# Patient Record
Sex: Male | Born: 1955 | Race: White | Hispanic: No | Marital: Married | Smoking: Never smoker
Health system: Southern US, Community
[De-identification: ages and names within clinical notes are randomized; demographics above are authoritative.]

## PROBLEM LIST (undated history)

## (undated) DIAGNOSIS — E785 Hyperlipidemia, unspecified: Secondary | ICD-10-CM

## (undated) DIAGNOSIS — T7840XA Allergy, unspecified, initial encounter: Secondary | ICD-10-CM

## (undated) DIAGNOSIS — I10 Essential (primary) hypertension: Secondary | ICD-10-CM

## (undated) DIAGNOSIS — K219 Gastro-esophageal reflux disease without esophagitis: Secondary | ICD-10-CM

## (undated) HISTORY — PX: COLONOSCOPY: SHX174

## (undated) HISTORY — PX: POLYPECTOMY: SHX149

## (undated) HISTORY — DX: Gastro-esophageal reflux disease without esophagitis: K21.9

## (undated) HISTORY — DX: Allergy, unspecified, initial encounter: T78.40XA

## (undated) HISTORY — DX: Hyperlipidemia, unspecified: E78.5

## (undated) HISTORY — DX: Essential (primary) hypertension: I10

---

## 2006-05-28 ENCOUNTER — Observation Stay (HOSPITAL_COMMUNITY): Admission: EM | Admit: 2006-05-28 | Discharge: 2006-05-28 | Payer: Self-pay | Admitting: Emergency Medicine

## 2006-05-28 ENCOUNTER — Ambulatory Visit: Payer: Self-pay | Admitting: Cardiology

## 2008-05-04 IMAGING — CR DG CHEST 1V PORT
1 series · 1 of 1 positions shown · non-contrast
Comparison: none

CLINICAL DATA: Chest pain and shortness of breath.  
 PORTABLE CHEST - 1 VIEW 05/28/06:

[view not recorded]
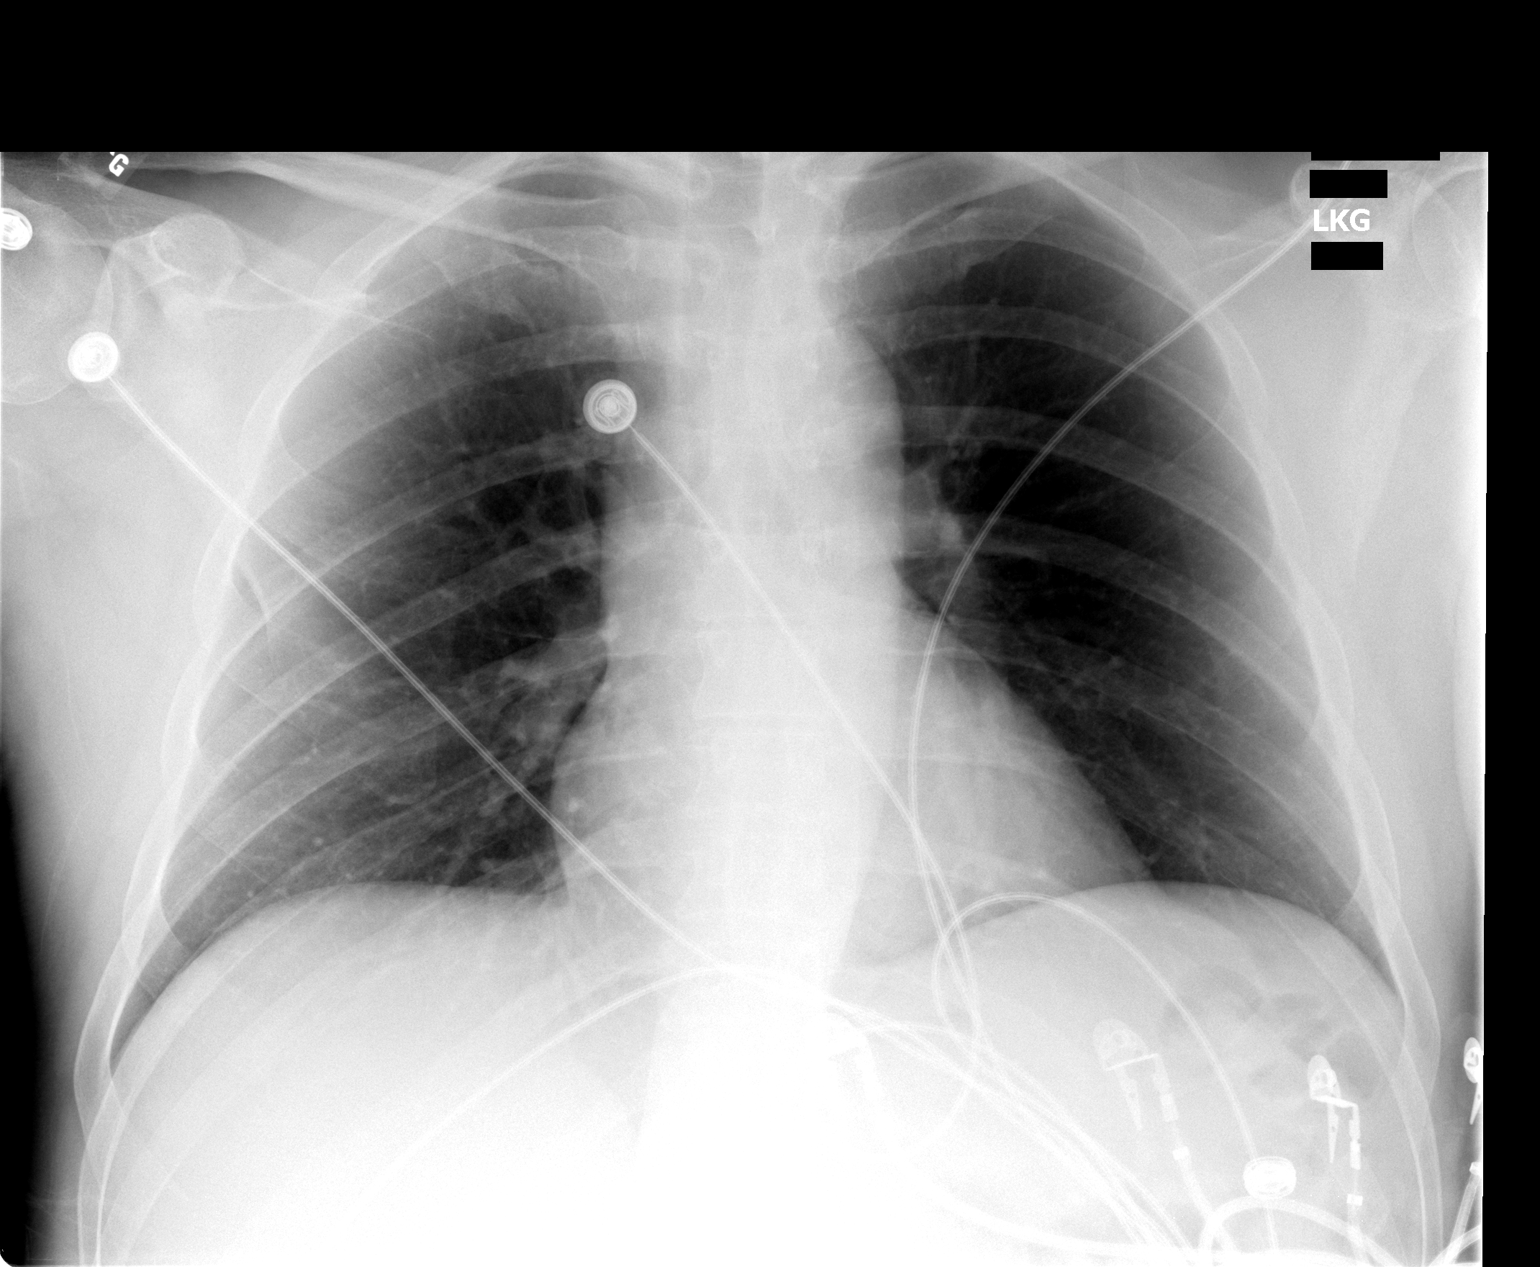

[1 of 1 positions shown; findings below may reference images not displayed]

FINDINGS: The heart size and mediastinal contours are within normal limits.  Both lungs are clear.
IMPRESSION: No acute findings.

## 2009-07-21 ENCOUNTER — Encounter (INDEPENDENT_AMBULATORY_CARE_PROVIDER_SITE_OTHER): Payer: Self-pay | Admitting: *Deleted

## 2009-07-22 ENCOUNTER — Ambulatory Visit: Payer: Self-pay | Admitting: Gastroenterology

## 2009-07-26 ENCOUNTER — Ambulatory Visit: Payer: Self-pay | Admitting: Gastroenterology

## 2009-08-03 ENCOUNTER — Encounter: Payer: Self-pay | Admitting: Gastroenterology

## 2010-10-06 NOTE — Letter (Signed)
Summary: [facility] Instructions  [facility]  [address] [place], [place] [address]   Phone: [phone]  Fax: [phone]       Johnny [name]    1955/09/08    MRN: [id number]        Procedure Day /Date: Monday 07/26/09     Arrival Time: 7:30 a.m.     Procedure Time: 8:30 a.m.     Location of Procedure:                       Endoscopy Center (4th Floor)                         PREPARATION FOR COLONOSCOPY WITH MOVIPREP   Starting 5 days prior to your procedure  07/21/09  do not eat nuts, seeds, popcorn, corn, beans, peas,  salads, or any raw vegetables.  Do not take any fiber supplements (e.g. Metamucil, Citrucel, and Benefiber).  THE DAY BEFORE YOUR PROCEDURE         DATE:  07/25/09 DAY: Sunday  1.  Drink clear liquids the entire day-NO SOLID FOOD  2.  Do not drink anything colored red or purple.  Avoid juices with pulp.  No orange juice.  3.  Drink at least 64 oz. (8 glasses) of fluid/clear liquids during the day to prevent dehydration and help the prep work efficiently.  CLEAR LIQUIDS INCLUDE: Water Jello Ice Popsicles Tea (sugar ok, no milk/cream) Powdered fruit flavored drinks Coffee (sugar ok, no milk/cream) Gatorade Juice: apple, white grape, white cranberry  Lemonade Clear bullion, consomm, broth Carbonated beverages (any kind) Strained chicken noodle soup Hard Candy                             4.  In the morning, mix first dose of MoviPrep solution:    Empty 1 Pouch A and 1 Pouch B into the disposable container    Add lukewarm drinking water to the top line of the container. Mix to dissolve    Refrigerate (mixed solution should be used within 24 hrs)  5.  Begin drinking the prep at 5:00 p.m. The MoviPrep container is divided by 4 marks.   Every 15 minutes drink the solution down to the next mark (approximately 8 oz) until the full liter is complete.    6.  Follow completed prep with 16 oz of clear liquid of your choice (Nothing red or purple).  Continue to drink clear liquids until bedtime.  7.  Before going to bed, mix second dose of MoviPrep solution:    Empty 1 Pouch A and 1 Pouch B into the disposable container    Add lukewarm drinking water to the top line of the container. Mix to dissolve    Refrigerate  THE DAY OF YOUR PROCEDURE      DATE:  07/26/09 DAY: Monday  Beginning at  3:30a.m. (5 hours before procedure):         1. Every 15 minutes, drink the solution down to the next mark (approx 8 oz) until the full liter is complete.  2. Follow completed prep with 16 oz. of clear liquid of your choice.    3. You may drink clear liquids until  6:30 a.m(2 HOURS BEFORE PROCEDURE).   MEDICATION INSTRUCTIONS  Unless otherwise instructed, you should take regular prescription medications with a small sip of water   as early as possible  the morning of your procedure.        OTHER INSTRUCTIONS  You will need a responsible adult at least 53 years of age to accompany you and drive you home.   This person must remain in the waiting room during your procedure.  Wear loose fitting clothing that is easily removed.  Leave jewelry and other valuables at home.  However, you may wish to bring a book to read or  an iPod/MP3 player to listen to music as you wait for your procedure to start.  Remove all body piercing jewelry and leave at home.  Total time from sign-in until discharge is approximately 2-3 hours.  You should go home directly after your procedure and rest.  You can resume normal activities the  day after your procedure.  The day of your procedure you should not:   Drive   Make legal decisions   Operate machinery   Drink alcohol   Return to work  You will receive specific instructions about eating, activities and medications before you leave.     The above instructions have been reviewed and explained to me by   Ezra Sites RN  July 22, 2009 8:08 AM     I fully understand and can verbalize these instructions _____________________________ Date _________

## 2010-10-06 NOTE — Procedures (Signed)
Summary: Colonoscopy  Patient: [name] Note: All result statuses are Final unless otherwise noted.  Tests: (1) Colonoscopy (COL)   COL Colonoscopy           DONE     [facility]     [address]     [place], [place]  [address]           COLONOSCOPY PROCEDURE REPORT           PATIENT:  Johnny [name], Johnny [name]  MR#:  [id number]     BIRTHDATE:  05-28-1956, 53 yrs. old  GENDER:  male           ENDOSCOPIST:  Vania Rea. Jarold Motto, MD, [facility]     Referred by:  Marinda Elk, M.D.           PROCEDURE DATE:  07/26/2009     PROCEDURE:  Colonoscopy with snare polypectomy     ASA CLASS:  Class I     INDICATIONS:  Routine Risk Screening           MEDICATIONS:   Fentanyl 75 mcg IV, Versed 7 mg IV           DESCRIPTION OF PROCEDURE:   After the risks benefits and     alternatives of the procedure were thoroughly explained, informed     consent was obtained.  Digital rectal exam was performed and     revealed no abnormalities.   The LB CF-[id number] endoscope     was introduced through the anus and advanced to the cecum, which     was identified by both the appendix and ileocecal valve, without     limitations.  The quality of the prep was excellent, using     MoviPrep.  The instrument was then slowly withdrawn as the colon     was fully examined.     <<PROCEDUREIMAGES>>           FINDINGS:  Mild diverticulosis was found in the sigmoid colon.  A     sessile polyp was found. It was likely benign. 5mm size. Polyp was     snared, then cauterized with monopolar cautery. Retrieval was     successful. snare polyp  This was otherwise a normal examination     of the colon.   Retroflexed views in the rectum revealed no     abnormalities.    The scope was then withdrawn from the patient     and the procedure completed.           COMPLICATIONS:  None           ENDOSCOPIC IMPRESSION:     1) Mild diverticulosis in the sigmoid colon     2) Sessile polyp     3) Otherwise normal examination      RECOMMENDATIONS:     1) Follow up colonoscopy in 5 years     2) high fiber diet           REPEAT EXAM:  No           ______________________________     Vania Rea. Jarold Motto, MD, [facility]           CC:           n.     eSIGNED:   Vania Rea. Daylin Gruszka at 07/26/2009 08:59 AM           [place], Mellody Dance, [id number]  Note: An exclamation mark (!) indicates a result that was not dispersed into  the flowsheet. Document Creation Date: 07/26/2009 8:59 AM _______________________________________________________________________  (1) Order result status: Final Collection or observation date-time: 07/26/2009 08:53 Requested date-time:  Receipt date-time:  Reported date-time:  Referring Physician:   Ordering Physician: Sheryn Bison [phone]) Specimen Source:  Source: Launa Grill Order Number: [id number] Lab site:   Appended Document: Colonoscopy     Procedures Next Due Date:    Colonoscopy: 08/2014

## 2011-01-20 NOTE — Cardiovascular Report (Signed)
NAME:  Johnny [name], Johnny [name]               ACCOUNT NO.:  [id number]   MEDICAL RECORD NO.:  [id number]          PATIENT TYPE:  INP   LOCATION:  6599                         FACILITY:  [facility]   PHYSICIAN:  Veverly Fells. Excell Seltzer, MD  DATE OF BIRTH:  1955-10-19   DATE OF PROCEDURE:  05/28/2006  DATE OF DISCHARGE:  05/28/2006                              CARDIAC CATHETERIZATION   PROCEDURE:  Left heart catheterization, selective coronary angiography, left  ventricular angiography, aortic root angiography and star close of the right  femoral artery.   INDICATION:  This is a 55 year old male with a very strong family history of  coronary artery disease and myocardial infarction who has a brother who had  an myocardial infarction in his 51's and both parents who had coronary  bypass surgery.  He presented with new onset chest pain to the Emergency  Department today.  His history was very compelling for unstable angina.  He  was subsequently referred for cardiac catheterization.   ACCESS:  Right femoral artery.   COMPLICATIONS:  None.   PROCEDURAL DETAILS:  The risks and indications of the procedure were  explained in detail to the patient.  Informed consent was obtained.  The  right groin was prepped and draped in normal sterile fashion.  Anesthetized  with 1% lidocaine.  Using the modified Seldinger technique a 6 French sheath  was placed in the right femoral artery.  Multiple angiographic views were  taken of the left and right coronary arteries.  The left coronary artery was  engaged with a 6 [place] JL4 catheter.  The right coronary artery was engaged  with a 6 [place] JR4 catheter.  Following coronary angiography a pigtail  catheter was passed across the aortic valve and the left ventricle.  Left  ventricular pressure was recorded.  A 30 degree RAO left ventriculogram was  performed.  Following left ventriculography a pull back was recorded across  the aortic valve.  Following pull back, because  of the patient's chest pain  and unremarkable coronary and left ventricular findings, aortic root  angiogram was performed.  At the conclusion of the case a right femoral  arteriogram was done and access site was in the right common femoral artery.  Therefore a star close closure device was deployed.   FINDINGS:  Aortic pressure 130/79 with a mean of 102.  Left ventricular  pressure 128/9 with an end diastolic pressure of 18.  Left main stem normal.  Bifurcates into the left anterior descending and left circumflex.  LAD is a  large diameter vessel that courses down and wraps around the apex of the  left ventricle.  It gives off two large proximal diagonal vessels.  It then  gives off two distal diagonals as well.  The LAD is normal throughout its  proximal and mid segments.  There is a long area in the distal LAD that has  very mild non-obstructive disease of approximately 20% stenosis.  The  remainder of the diagonals and LAD system are angiographically normal.   Left circumflex covers a very small territory.  Left circumflex courses down  the AV groove and gives off one small obtuse marginal and this system is  angiographically normal.  Right coronary artery is dominant.  It gives off a  large conus branch.  The right coronary gives off an RV marginal branch in  the mid segment.  Distally it bifurcates into the PDA and posterior AV  segment.  The posterior AV segment gives off one branching posterior lateral  branch and the PDA is large diameter and dominant.  There is no angiographic  disease in the right coronary artery.   Left ventriculogram demonstrates normal left ventricular function with an  ejection fraction of 65%.  There is no mitral regurgitation.   LAO, aortic root angiogram was normal with no aortic insufficiency and no  aortic dilatation.   ASSESSMENT:  1. Mild non-obstructive plaque in LAD.  2. Normal left circumflex.  3. Normal right coronary artery.  4. Normal  left ventricular function.  5. Normal aortic root.   PLAN:  As the patient is currently chest pain free and his cardiac  catheterization is unremarkable, I would recommend medical therapy with  aspirin and Atorvastatin.  He should be able to return home later this  evening.  Of note, he did have a D-dimer performed to evaluate for pulmonary  embolus and this was within the normal range.  He should follow-up with Dr.  Daleen Squibb in approximately two to four weeks for outpatient follow-up.      Veverly Fells. Excell Seltzer, MD  Electronically Signed     MDC/MEDQ  D:  05/28/2006  T:  05/30/2006  Job:  [id number]   cc:   Thomas C. Daleen Squibb, MD, [facility]  Joycelyn Rua, M.D.

## 2011-01-20 NOTE — Discharge Summary (Signed)
NAME:  Johnny [name], Johnny [name]               ACCOUNT NO.:  [id number]   MEDICAL RECORD NO.:  [id number]          PATIENT TYPE:  INP   LOCATION:                               FACILITY:  [facility]   PHYSICIAN:  Thomas C. Wall, MD, FACCDATE OF BIRTH:  1956/02/10   DATE OF ADMISSION:  05/28/2006  DATE OF DISCHARGE:  05/28/2006                                 DISCHARGE SUMMARY   PRIMARY CARE PHYSICIAN:  Dr. Silvestre Moment.   PRIMARY CARDIOLOGIST:  Patient is new to [facility], being seen by  Dr. Daleen Squibb.   PRINCIPAL DIAGNOSIS:  Chest pain.   OTHER DIAGNOSES:  1. Nonobstructive coronary artery disease.  2. GERD.  3. Hyperlipidemia.   ALLERGIES:  No known drug allergies.   PROCEDURES:  Left heart cardiac catheterization.   HISTORY OF PRESENT ILLNESS:  55 year old married white male with no prior  history of CAD with fairly significant family history of CAD who presented  to the [facility] ED on May 28, 2006, after awakening at 3:30 a.m.  with chest tightness and recurrent tightness at 5:40 a.m. associated with  shortness of breath and diaphoresis with radiation to the left jaw and right  shoulder.  In the ED, ECG revealed sinus rhythm without any acute ST-T  changes and cardiac markers were negative.  His symptoms were felt to be  very concerning for unstable angina, especially in light of his family  history, and decision was made to admit him for cardiac catheterization.   HOSPITAL COURSE:  Left heart cardiac catheterization was performed on  September 24th revealing a normal left main, a 20% stenosis in the distal  LAD, and a normal right coronary artery and left circumflex.  His EF was  normal without any regional wall motion abnormalities.  Did not appear that  his chest pain is cardiac in nature and he is being discharged home this  evening in satisfactory condition.  He is advised to follow up with Dr.  Silvestre Moment in the next 1 to 2 weeks for additional evaluation possibly  including GI workup.   DISCHARGE LABS:  Hemoglobin 14.9, WBC 10.3, platelets 199, sodium 143,  potassium 4.5, chloride 103, CO2 30, BUN 14, creatinine 1.9, glucose 115,  total bilirubin 0.8, alkaline phosphatase 67, AST 27, ALT 42, total protein  6.2, albumin 3.9, CK-MB less than 1.0, troponin I less than 0.05, calcium  9.1.   DISPOSITION:  Patient is being discharged home today in good condition.   FOLLOWUP PLANS AND APPOINTMENTS:  He is asked to follow up with his primary  care physician, Dr. Silvestre Moment, within 1 to 2 weeks.   DISCHARGE MEDICATIONS:  1. Aspirin 81 mg daily.  2. Lipitor 40 mg q.p.m.  3. Multivitamin 1 daily.   PENDING LAB STUDIES:  None.   DURATION OF DISCHARGE ENCOUNTER:  35 minutes including physician time.     ______________________________  Nicolasa Ducking, ANP      Jesse Sans. Daleen Squibb, MD, [facility]  Electronically Signed    CB/MEDQ  D:  05/28/2006  T:  05/28/2006  Job:  [id number]

## 2011-01-20 NOTE — H&P (Signed)
NAME:  Johnny [name], Johnny [name]               ACCOUNT NO.:  [id number]   MEDICAL RECORD NO.:  [id number]          PATIENT TYPE:  INP   LOCATION:  1824                         FACILITY:  [facility]   PHYSICIAN:  Thomas C. Wall, MD, FACCDATE OF BIRTH:  [birth year]   DATE OF ADMISSION:  05/28/2006  DATE OF DISCHARGE:                                HISTORY & PHYSICAL   CHIEF COMPLAINT:  Chest pain.   PRIMARY MD:  Joycelyn Rua, M.D., [facility].   HISTORY OF PRESENT ILLNESS:  Mr. Blazejewski is a 55 year old man with a history  of hyperlipidemia and a strong family history positive for coronary artery  disease, who presents with a complaint of chest pain.  He awoke at 10:30  this morning with chest tightness and then again at 5:40 with worsening  chest tightness associated with shortness of breath and diaphoresis, plus  6/10 pain with radiation to his left arm and right shoulder.  The pain is  now 1-2/10 after nitroglycerin, morphine, and Zofran.  Patient denies any  syncope or nausea.  He recently traveled via car, about a six hour trip.  He  had chest tightness two days prior to admission as well without any  associated factors.  The pain today was relieved by nitroglycerin.   ALLERGIES:  No contrast allergy and no known drug allergies.   PAST MEDICAL HISTORY:  1. He has not had any prior cardiac catheterizations, CABG, or 2D echos.  2. GERD.  3. Hyperlipidemia.   MEDICATIONS:  1. Lipitor 40 mg p.o. daily.  2. Aspirin 81 mg p.o. daily.  3. Multivitamin daily.  4. Tums p.r.n.   SOCIAL HISTORY:  Patient lives in [place] with his wife and works as an  Film/video editor at [employer]  He is married with three children.  He has  never used any tobacco.  He drinks wine about four times a week, and he  denies any illegal drug use.   FAMILY HISTORY:  His mother passed away at the age of 67 of an MI.  She was  status post CABG and had a pacemaker.  His father passed away at the age of  87 of an  MI, and he was status post CABG as well.  He has three siblings,  one brother who is alive at 25, who had an MI, status post angioplasty, one  sister who is alive at 68, who has no health problems, and a sister who is  alive at 96 with an arrhythmia.   REVIEW OF SYSTEMS:  As per the HPI.   PHYSICAL EXAMINATION:  VITAL SIGNS:  Temperature 97.3, pulse 98, respiratory  rate 16, blood pressure 99/61.  GENERAL:  He was awake, alert and oriented x3 in no acute distress.  HEENT:  Normocephalic and atraumatic.  Pupils are equal, round and reactive  to light.  Extraocular movements are intact.  Sclerae are clear.  NECK:  Supple with no lymphadenopathy, bruits, and no JVD.  LUNGS:  Clear to auscultation bilaterally.  HEART:  Regular rate and rhythm.  S1 and S2.  Negative S3, S4.  No  murmurs  are appreciated.  ABDOMEN:  Soft, nontender, nondistended.  Bowel sounds were present.  There  was no rebound or guarding.  There was no hepatosplenomegaly.  EXTREMITIES:  No clubbing, cyanosis or edema.  There were no femoral bruits.  SKIN:  No rashes or lesions.  NEUROLOGIC:  He was grossly intact.   STUDIES:  Chest x-ray was no acute cardiopulmonary disease.   EKG showed normal sinus rhythm with a rate in about the 90s.  No ST segment  elevation, depression, or T wave inversions were noted.  Intervals were  within normal limits.  The axis was normal.   LABORATORY DATA:  White blood cell count 10.3, hemoglobin 14.9, platelets  199, MCV 90.  Sodium 143, potassium 4.5, chloride 103, bicarb 30, BUN 14,  creatinine 1, glucose 115.  Anion gap was 10.  Total bilirubin 0.8, AST 27,  ALT 42, alk phos 67, total protein 6.2, albumin 3.9.  Cardiac point-of-care  markers had a CK-MB of less than 1 and troponin of less than 0.05.  Calcium  was 9.1.   ASSESSMENT:  Mr. Bogusz is a 55 year old man with a history of  hyperlipidemia and a strong family history of coronary artery disease who is  likely suffering from  unstable angina.  He will need to have a cath today to  rule out coronary artery disease.  His recent travel with chest pain  (although not pleuritic) and shortness of breath is also concerning for a  pulmonary embolus but less likely than unstable angina, given his history.   PLAN:  1. We will admit the patient to the ICU and plan to do a cardiac      catheterization later on this afternoon.  2. Heparin drip per pharmacy.  3. Lopressor 25 mg p.o. q.6h. with parameters.  4. Aspirin 325 mg p.o. daily.  5. Plavix 75 mg p.o. daily.  6. Morphine 1 mg IV q.4h. p.r.n. chest pain.  7. Nitroglycerin drip titrate to chest painfree with parameters.  8. Will check a PT/PTT, D-dimer, TSH, and a lipid profile today, as the      patient has not eaten anything since last night.  9. He needs to have an EKG post cath as well as in the morning.  We will      also cycle cardiac enzymes.  10.Lipitor 80 mg p.o. daily.  11.Protonix 40 mg p.o. daily.   This case was discussed with Dr. Valera Castle.      Chauncey Reading, D.O.  Electronically Signed      Jesse Sans. Daleen Squibb, MD, [facility]  Electronically Signed    EA/MEDQ  D:  05/28/2006  T:  05/29/2006  Job:  [id number]   cc:   Joycelyn Rua, M.D.

## 2014-06-09 ENCOUNTER — Encounter: Payer: Self-pay | Admitting: Internal Medicine

## 2014-10-19 ENCOUNTER — Ambulatory Visit (AMBULATORY_SURGERY_CENTER): Payer: Self-pay | Admitting: *Deleted

## 2014-10-19 VITALS — Ht 70.0 in | Wt 193.0 lb

## 2014-10-19 DIAGNOSIS — Z86010 Personal history of colonic polyps: Secondary | ICD-10-CM

## 2014-10-19 MED ORDER — MOVIPREP 100 G PO SOLR: 1.0000 | Freq: Once | ORAL | Status: DC

## 2014-10-19 NOTE — Progress Notes (Signed)
No egg or soy allergy No issues with past sedation No diet pills No home 02  Pt declined emmi

## 2014-11-02 ENCOUNTER — Encounter: Payer: Self-pay | Admitting: Internal Medicine

## 2014-11-02 ENCOUNTER — Ambulatory Visit (AMBULATORY_SURGERY_CENTER): Payer: 59 | Admitting: Internal Medicine

## 2014-11-02 VITALS — BP 137/79 | HR 62 | Temp 96.9°F | Resp 12 | Ht 70.0 in | Wt 193.0 lb

## 2014-11-02 DIAGNOSIS — D120 Benign neoplasm of cecum: Secondary | ICD-10-CM

## 2014-11-02 DIAGNOSIS — D123 Benign neoplasm of transverse colon: Secondary | ICD-10-CM

## 2014-11-02 DIAGNOSIS — Z86010 Personal history of colonic polyps: Secondary | ICD-10-CM

## 2014-11-02 HISTORY — PX: COLONOSCOPY: SHX174

## 2014-11-02 MED ORDER — SODIUM CHLORIDE 0.9 % IV SOLN: 500.0000 mL | INTRAVENOUS | Status: DC

## 2014-11-02 NOTE — Progress Notes (Signed)
Called to room to assist during endoscopic procedure.  Patient ID and intended procedure confirmed with present staff. Received instructions for my participation in the procedure from the performing physician.  

## 2014-11-02 NOTE — Progress Notes (Signed)
Report to PACU, RN, vss, BBS= Clear.  

## 2014-11-02 NOTE — Op Note (Signed)
[facility]  [address] [place], [id number]   COLONOSCOPY PROCEDURE REPORT  PATIENT: Johnny, [name]  MR#: [id number] BIRTHDATE: [birth year] , 56  yrs. old GENDER: male ENDOSCOPIST: Jerene Bears, MD REFERRED BO:FBPZW Patterson, M.D. PROCEDURE DATE:  11/02/2014 PROCEDURE:   Colonoscopy with snare polypectomy First Screening Colonoscopy - Avg.  risk and is 50 yrs.  old or older - No.  Prior Negative Screening - Now for repeat screening. N/A  History of Adenoma - Now for follow-up colonoscopy & has been > or = to 3 yrs.  Yes hx of adenoma.  Has been 3 or more years since last colonoscopy.  Polyps Removed Today? Yes. ASA CLASS:   Class II INDICATIONS:high risk patient with personal history of colonic polyps. MEDICATIONS: Monitored anesthesia care and Propofol 200 mg IV  DESCRIPTION OF PROCEDURE:   After the risks benefits and alternatives of the procedure were thoroughly explained, informed consent was obtained.  The digital rectal exam revealed no rectal mass.   The LB [id number]  endoscope was introduced through the anus and advanced to the cecum, which was identified by both the appendix and ileocecal valve. No adverse events experienced. The quality of the prep was good, using MoviPrep  The instrument was then slowly withdrawn as the colon was fully examined.  COLON FINDINGS: Two sessile polyps ranging between 3-40mm in size were found at the cecum.  Polypectomies were performed with a cold snare.  The resection was complete, the polyp tissue was completely retrieved and sent to histology.   A sessile polyp ranging between 3-52mm in size was found in the transverse colon.  A polypectomy was performed with a cold snare.  The resection was complete, the polyp tissue was completely retrieved and sent to histology.   There was mild diverticulosis noted in the transverse colon, descending colon, and sigmoid colon.  Retroflexed views revealed  internal hemorrhoids. The time to cecum=3 minutes 26 seconds.  Withdrawal time=9 minutes 57 seconds.  The scope was withdrawn and the procedure completed. COMPLICATIONS: There were no immediate complications.  ENDOSCOPIC IMPRESSION: 1.   Two sessile polyps ranging between 3-37mm in size were found at the cecum; polypectomies were performed with a cold snare 2.   Sessile polyp ranging between 3-18mm in size was found in the transverse colon; polypectomy was performed with a cold snare 3.   Mild diverticulosis was noted in the transverse colon, descending colon, and sigmoid colon  RECOMMENDATIONS: 1.  Await pathology results 2.  High fiber diet 3.  Repeat Colonoscopy in 5 years. 4.  You will receive a letter within 1-2 weeks with the results of your biopsy as well as final recommendations.  Please call my office if you have not received a letter after 3 weeks.  eSigned:  Jerene Bears, MD 11/02/2014 9:29 AM   cc: The Patient and Orpah Melter, MD   PATIENT NAME:  Johnny, [name] MR#: [id number]

## 2014-11-02 NOTE — Patient Instructions (Signed)
FOLLOW DISCHARGE INSTRUCTIONS (BLUE AND GREEN SHEETS).YOU HAD AN ENDOSCOPIC PROCEDURE TODAY AT [facility]:   Refer to the procedure report that was given to you for any specific questions about what was found during the examination.  If the procedure report does not answer your questions, please call your gastroenterologist to clarify.  If you requested that your care partner not be given the details of your procedure findings, then the procedure report has been included in a sealed envelope for you to review at your convenience later.  YOU SHOULD EXPECT: Some feelings of bloating in the abdomen. Passage of more gas than usual.  Walking can help get rid of the air that was put into your GI tract during the procedure and reduce the bloating. If you had a lower endoscopy (such as a colonoscopy or flexible sigmoidoscopy) you may notice spotting of blood in your stool or on the toilet paper. If you underwent a bowel prep for your procedure, you may not have a normal bowel movement for a few days.  Please Note:  You might notice some irritation and congestion in your nose or some drainage.  This is from the oxygen used during your procedure.  There is no need for concern and it should clear up in a day or so.  SYMPTOMS TO REPORT IMMEDIATELY:   Following lower endoscopy (colonoscopy or flexible sigmoidoscopy):  Excessive amounts of blood in the stool  Significant tenderness or worsening of abdominal pains  Swelling of the abdomen that is new, acute  Fever of 100F or higher    A gastroenterologist can be reached at any hour by calling [phone].   DIET: Your first meal following the procedure should be a small meal and then it is ok to progress to your normal diet. Heavy or fried foods are harder to digest and may make you feel nauseous or bloated.  Likewise, meals heavy in dairy and vegetables can increase bloating.  Drink plenty of fluids but you should avoid alcoholic  beverages for 24 hours.  ACTIVITY:  You should plan to take it easy for the rest of today and you should NOT DRIVE or use heavy machinery until tomorrow (because of the sedation medicines used during the test).    FOLLOW UP: Our staff will call the number listed on your records the next business day following your procedure to check on you and address any questions or concerns that you may have regarding the information given to you following your procedure. If we do not reach you, we will leave a message.  However, if you are feeling well and you are not experiencing any problems, there is no need to return our call.  We will assume that you have returned to your regular daily activities without incident.  If any biopsies were taken you will be contacted by phone or by letter within the next 1-3 weeks.  Please call us at [phone] if you have not heard about the biopsies in 3 weeks.    SIGNATURES/CONFIDENTIALITY: You and/or your care partner have signed paperwork which will be entered into your electronic medical record.  These signatures attest to the fact that that the information above on your After Visit Summary has been reviewed and is understood.  Full responsibility of the confidentiality of this discharge information lies with you and/or your care-partner.  Polyp, diverticulosis, and high fiber diet information given.  Next colonoscopy 5 years.

## 2014-11-03 ENCOUNTER — Telehealth: Payer: Self-pay | Admitting: *Deleted

## 2014-11-03 NOTE — Telephone Encounter (Signed)
  Follow up Call-  Call back number 11/02/2014  Post procedure Call Back phone  # [phone]  Permission to leave phone message Yes     Patient questions:  Do you have a fever, pain , or abdominal swelling? No. Pain Score  0 *  Have you tolerated food without any problems? Yes.    Have you been able to return to your normal activities? No.  Do you have any questions about your discharge instructions: Diet   No. Medications  No. Follow up visit  No.  Do you have questions or concerns about your Care? No.  Actions: * If pain score is 4 or above: No action needed, pain <4. Patient is back to his normal activities, option of no hit accidentally.

## 2014-11-09 ENCOUNTER — Encounter: Payer: Self-pay | Admitting: Internal Medicine

## 2015-03-03 ENCOUNTER — Encounter: Payer: Self-pay | Admitting: Gastroenterology

## 2017-05-03 DIAGNOSIS — H5213 Myopia, bilateral: Secondary | ICD-10-CM | POA: Diagnosis not present

## 2017-05-28 DIAGNOSIS — I10 Essential (primary) hypertension: Secondary | ICD-10-CM | POA: Diagnosis not present

## 2017-05-28 DIAGNOSIS — E7800 Pure hypercholesterolemia, unspecified: Secondary | ICD-10-CM | POA: Diagnosis not present

## 2017-05-28 DIAGNOSIS — Z0000 Encounter for general adult medical examination without abnormal findings: Secondary | ICD-10-CM | POA: Diagnosis not present

## 2017-05-28 DIAGNOSIS — Z23 Encounter for immunization: Secondary | ICD-10-CM | POA: Diagnosis not present

## 2017-05-28 DIAGNOSIS — K219 Gastro-esophageal reflux disease without esophagitis: Secondary | ICD-10-CM | POA: Diagnosis not present

## 2018-04-23 DIAGNOSIS — Z0000 Encounter for general adult medical examination without abnormal findings: Secondary | ICD-10-CM | POA: Diagnosis not present

## 2018-04-23 DIAGNOSIS — Z125 Encounter for screening for malignant neoplasm of prostate: Secondary | ICD-10-CM | POA: Diagnosis not present

## 2018-04-23 DIAGNOSIS — E7800 Pure hypercholesterolemia, unspecified: Secondary | ICD-10-CM | POA: Diagnosis not present

## 2018-04-23 DIAGNOSIS — I10 Essential (primary) hypertension: Secondary | ICD-10-CM | POA: Diagnosis not present

## 2018-04-23 DIAGNOSIS — Z23 Encounter for immunization: Secondary | ICD-10-CM | POA: Diagnosis not present

## 2018-05-01 DIAGNOSIS — H5213 Myopia, bilateral: Secondary | ICD-10-CM | POA: Diagnosis not present

## 2018-07-02 DIAGNOSIS — Z23 Encounter for immunization: Secondary | ICD-10-CM | POA: Diagnosis not present

## 2018-08-16 DIAGNOSIS — Z23 Encounter for immunization: Secondary | ICD-10-CM | POA: Diagnosis not present

## 2018-10-17 DIAGNOSIS — Z23 Encounter for immunization: Secondary | ICD-10-CM | POA: Diagnosis not present

## 2019-04-05 HISTORY — PX: CATARACT EXTRACTION, BILATERAL: SHX1313

## 2019-09-29 ENCOUNTER — Encounter: Payer: Self-pay | Admitting: *Deleted

## 2019-10-30 ENCOUNTER — Encounter: Payer: Self-pay | Admitting: Internal Medicine

## 2019-11-18 DIAGNOSIS — I10 Essential (primary) hypertension: Secondary | ICD-10-CM | POA: Insufficient documentation

## 2019-12-01 ENCOUNTER — Ambulatory Visit (AMBULATORY_SURGERY_CENTER): Payer: Self-pay | Admitting: *Deleted

## 2019-12-01 ENCOUNTER — Other Ambulatory Visit: Payer: Self-pay

## 2019-12-01 VITALS — Temp 97.1°F | Ht 70.0 in | Wt 179.0 lb

## 2019-12-01 DIAGNOSIS — Z01818 Encounter for other preprocedural examination: Secondary | ICD-10-CM

## 2019-12-01 DIAGNOSIS — Z86010 Personal history of colonic polyps: Secondary | ICD-10-CM

## 2019-12-01 MED ORDER — NA SULFATE-K SULFATE-MG SULF 17.5-3.13-1.6 GM/177ML PO SOLN: ORAL | 0 refills | Status: DC

## 2019-12-01 NOTE — Progress Notes (Signed)
Patient is here in-person for PV. Patient denies any allergies to eggs or soy. Patient denies any problems with anesthesia/sedation. Patient denies any oxygen use at home. Patient denies taking any diet/weight loss medications or blood thinners. Patient is not being treated for MRSA or C-diff. EMMI education assisgned to the patient for the procedure, this was explained and instructions given to patient. COVID-19 screening test is on 4/7, the pt is aware.  Patient is aware of our care-partner policy and 0000000 safety protocol.   Prep Prescription coupon given to the patient.

## 2019-12-10 ENCOUNTER — Ambulatory Visit (INDEPENDENT_AMBULATORY_CARE_PROVIDER_SITE_OTHER): Payer: 59

## 2019-12-10 ENCOUNTER — Other Ambulatory Visit: Payer: Self-pay | Admitting: Internal Medicine

## 2019-12-10 DIAGNOSIS — Z1159 Encounter for screening for other viral diseases: Secondary | ICD-10-CM

## 2019-12-11 ENCOUNTER — Encounter: Payer: Self-pay | Admitting: Internal Medicine

## 2019-12-11 LAB — SARS CORONAVIRUS 2 (TAT 6-24 HRS): SARS Coronavirus 2: NEGATIVE

## 2019-12-15 ENCOUNTER — Encounter: Payer: Self-pay | Admitting: Internal Medicine

## 2019-12-15 ENCOUNTER — Ambulatory Visit (AMBULATORY_SURGERY_CENTER): Payer: 59 | Admitting: Internal Medicine

## 2019-12-15 ENCOUNTER — Other Ambulatory Visit: Payer: Self-pay

## 2019-12-15 VITALS — BP 149/76 | HR 63 | Temp 96.6°F | Resp 12 | Ht 70.0 in | Wt 179.0 lb

## 2019-12-15 DIAGNOSIS — Z86010 Personal history of colonic polyps: Secondary | ICD-10-CM | POA: Diagnosis not present

## 2019-12-15 DIAGNOSIS — D124 Benign neoplasm of descending colon: Secondary | ICD-10-CM

## 2019-12-15 MED ORDER — SODIUM CHLORIDE 0.9 % IV SOLN: 500.0000 mL | Freq: Once | INTRAVENOUS | Status: DC

## 2019-12-15 NOTE — Op Note (Signed)
[place] Patient Name: Johnny [name] Procedure Date: 12/15/2019 11:07 AM MRN: [id number] Endoscopist: Jerene Bears , MD Age: 64 Referring MD:  Date of Birth: 04-23-1956 Gender: Male Account #: [id number] Procedure:                Colonoscopy Indications:              High risk colon cancer surveillance: Personal                            history of non-advanced adenoma and sessile                            serrated colon polyp (< 10 mm), Last colonoscopy:                            February 2016 Medicines:                Monitored Anesthesia Care Procedure:                Pre-Anesthesia Assessment:                           - Prior to the procedure, a History and Physical                            was performed, and patient medications and                            allergies were reviewed. The patient's tolerance of                            previous anesthesia was also reviewed. The risks                            and benefits of the procedure and the sedation                            options and risks were discussed with the patient.                            All questions were answered, and informed consent                            was obtained. Prior Anticoagulants: The patient has                            taken no previous anticoagulant or antiplatelet                            agents. ASA Grade Assessment: II - A patient with                            mild systemic disease. After reviewing the risks  and benefits, the patient was deemed in                            satisfactory condition to undergo the procedure.                           After obtaining informed consent, the colonoscope                            was passed under direct vision. Throughout the                            procedure, the patient's blood pressure, pulse, and                            oxygen saturations were monitored continuously. The                    Colonoscope was introduced through the anus and                            advanced to the cecum, identified by appendiceal                            orifice and ileocecal valve. The colonoscopy was                            performed without difficulty. The patient tolerated                            the procedure well. The quality of the bowel                            preparation was good. The ileocecal valve,                            appendiceal orifice, and rectum were photographed. Scope In: 11:17:11 AM Scope Out: 11:31:45 AM Scope Withdrawal Time: 0 hours 12 minutes 0 seconds  Total Procedure Duration: 0 hours 14 minutes 34 seconds  Findings:                 The digital rectal exam was normal.                           A 4 mm polyp was found in the descending colon. The                            polyp was sessile. The polyp was removed with a                            cold snare. Resection and retrieval were complete.                           Multiple small-mouthed diverticula were found in  the sigmoid colon and descending colon.                           The retroflexed view of the distal rectum and anal                            verge was normal and showed no anal or rectal                            abnormalities. Complications:            No immediate complications. Estimated Blood Loss:     Estimated blood loss was minimal. Impression:               - One 4 mm polyp in the descending colon, removed                            with a cold snare. Resected and retrieved.                           - Diverticulosis in the sigmoid colon and in the                            descending colon.                           - The distal rectum and anal verge are normal on                            retroflexion view. Recommendation:           - Patient has a contact number available for                            emergencies. The signs  and symptoms of potential                            delayed complications were discussed with the                            patient. Return to normal activities tomorrow.                            Written discharge instructions were provided to the                            patient.                           - Resume previous diet.                           - Continue present medications.                           - Await pathology results.                           -  Repeat colonoscopy is recommended for                            surveillance. The colonoscopy date will be                            determined after pathology results from today's                            exam become available for review. Jerene Bears, MD 12/15/2019 11:34:33 AM This report has been signed electronically.

## 2019-12-15 NOTE — Progress Notes (Signed)
Called to room to assist during endoscopic procedure.  Patient ID and intended procedure confirmed with present staff. Received instructions for my participation in the procedure from the performing physician.  

## 2019-12-15 NOTE — Progress Notes (Signed)
Pt's states no medical or surgical changes since previsit or office visit.  ADB - temp DT - vitals

## 2019-12-15 NOTE — Progress Notes (Signed)
To PACU, VSS. Report to [email or web address] 

## 2019-12-15 NOTE — Patient Instructions (Signed)
HANDOUTS ON POLYPS & DIVERTICULOSIS GIVEN TO YOU TODAY  AWAIT PATHOLOGY RESULTS ON POLYP REMOVED TODAY   YOU HAD AN ENDOSCOPIC PROCEDURE TODAY AT [facility]:   Refer to the procedure report that was given to you for any specific questions about what was found during the examination.  If the procedure report does not answer your questions, please call your gastroenterologist to clarify.  If you requested that your care partner not be given the details of your procedure findings, then the procedure report has been included in a sealed envelope for you to review at your convenience later.  YOU SHOULD EXPECT: Some feelings of bloating in the abdomen. Passage of more gas than usual.  Walking can help get rid of the air that was put into your GI tract during the procedure and reduce the bloating. If you had a lower endoscopy (such as a colonoscopy or flexible sigmoidoscopy) you may notice spotting of blood in your stool or on the toilet paper. If you underwent a bowel prep for your procedure, you may not have a normal bowel movement for a few days.  Please Note:  You might notice some irritation and congestion in your nose or some drainage.  This is from the oxygen used during your procedure.  There is no need for concern and it should clear up in a day or so.  SYMPTOMS TO REPORT IMMEDIATELY:   Following lower endoscopy (colonoscopy or flexible sigmoidoscopy):  Excessive amounts of blood in the stool  Significant tenderness or worsening of abdominal pains  Swelling of the abdomen that is new, acute  Fever of 100F or higher    For urgent or emergent issues, a gastroenterologist can be reached at any hour by calling [phone]. Do not use MyChart messaging for urgent concerns.    DIET:  We do recommend a small meal at first, but then you may proceed to your regular diet.  Drink plenty of fluids but you should avoid alcoholic beverages for 24 hours.  ACTIVITY:  You  should plan to take it easy for the rest of today and you should NOT DRIVE or use heavy machinery until tomorrow (because of the sedation medicines used during the test).    FOLLOW UP: Our staff will call the number listed on your records 48-72 hours following your procedure to check on you and address any questions or concerns that you may have regarding the information given to you following your procedure. If we do not reach you, we will leave a message.  We will attempt to reach you two times.  During this call, we will ask if you have developed any symptoms of COVID 19. If you develop any symptoms (ie: fever, flu-like symptoms, shortness of breath, cough etc.) before then, please call [phone].  If you test positive for Covid 19 in the 2 weeks post procedure, please call and report this information to [place].    If any biopsies were taken you will be contacted by phone or by letter within the next 1-3 weeks.  Please call us at [phone] if you have not heard about the biopsies in 3 weeks.    SIGNATURES/CONFIDENTIALITY: You and/or your care partner have signed paperwork which will be entered into your electronic medical record.  These signatures attest to the fact that that the information above on your After Visit Summary has been reviewed and is understood.  Full responsibility of the confidentiality of this discharge information lies with you  and/or your care-partner. 

## 2019-12-17 ENCOUNTER — Telehealth: Payer: Self-pay

## 2019-12-17 ENCOUNTER — Encounter: Payer: Self-pay | Admitting: Internal Medicine

## 2019-12-17 ENCOUNTER — Telehealth: Payer: Self-pay | Admitting: *Deleted

## 2019-12-17 NOTE — Telephone Encounter (Signed)
No answer for post procedure call back. Left message for patient to call with questions or concerns. 

## 2019-12-17 NOTE — Telephone Encounter (Signed)
NO ANSWER, MESSAGE LEFT FOR PATIENT. 

## 2020-04-28 DIAGNOSIS — I10 Essential (primary) hypertension: Secondary | ICD-10-CM | POA: Diagnosis not present

## 2020-04-28 DIAGNOSIS — Z0000 Encounter for general adult medical examination without abnormal findings: Secondary | ICD-10-CM | POA: Diagnosis not present

## 2020-04-28 DIAGNOSIS — E7800 Pure hypercholesterolemia, unspecified: Secondary | ICD-10-CM | POA: Diagnosis not present

## 2021-05-02 DIAGNOSIS — Z23 Encounter for immunization: Secondary | ICD-10-CM | POA: Diagnosis not present

## 2021-05-02 DIAGNOSIS — I10 Essential (primary) hypertension: Secondary | ICD-10-CM | POA: Diagnosis not present

## 2021-05-02 DIAGNOSIS — E7800 Pure hypercholesterolemia, unspecified: Secondary | ICD-10-CM | POA: Diagnosis not present

## 2021-05-02 DIAGNOSIS — Z0000 Encounter for general adult medical examination without abnormal findings: Secondary | ICD-10-CM | POA: Diagnosis not present

## 2022-06-12 DIAGNOSIS — Z23 Encounter for immunization: Secondary | ICD-10-CM | POA: Diagnosis not present

## 2022-06-12 DIAGNOSIS — I10 Essential (primary) hypertension: Secondary | ICD-10-CM | POA: Diagnosis not present

## 2022-06-12 DIAGNOSIS — E7800 Pure hypercholesterolemia, unspecified: Secondary | ICD-10-CM | POA: Diagnosis not present

## 2022-06-12 DIAGNOSIS — Z0000 Encounter for general adult medical examination without abnormal findings: Secondary | ICD-10-CM | POA: Diagnosis not present

## 2022-06-12 DIAGNOSIS — Z125 Encounter for screening for malignant neoplasm of prostate: Secondary | ICD-10-CM | POA: Diagnosis not present
